# Patient Record
Sex: Female | Born: 1965 | Race: White | Hispanic: No | Marital: Married | Smoking: Current every day smoker
Health system: Southern US, Community
[De-identification: ages and names within clinical notes are randomized; demographics above are authoritative.]

## PROBLEM LIST (undated history)

## (undated) DIAGNOSIS — E785 Hyperlipidemia, unspecified: Secondary | ICD-10-CM

## (undated) HISTORY — DX: Hyperlipidemia, unspecified: E78.5

---

## 2001-12-06 HISTORY — PX: ABDOMINAL HYSTERECTOMY: SHX81

## 2002-02-01 ENCOUNTER — Observation Stay (HOSPITAL_COMMUNITY): Admission: RE | Admit: 2002-02-01 | Discharge: 2002-02-02 | Payer: Self-pay | Admitting: Obstetrics and Gynecology

## 2003-10-29 ENCOUNTER — Other Ambulatory Visit: Admission: RE | Admit: 2003-10-29 | Discharge: 2003-10-29 | Payer: Self-pay | Admitting: Obstetrics and Gynecology

## 2005-01-20 ENCOUNTER — Other Ambulatory Visit: Admission: RE | Admit: 2005-01-20 | Discharge: 2005-01-20 | Payer: Self-pay | Admitting: Obstetrics and Gynecology

## 2007-03-27 ENCOUNTER — Ambulatory Visit (HOSPITAL_COMMUNITY): Admission: RE | Admit: 2007-03-27 | Discharge: 2007-03-27 | Payer: Self-pay | Admitting: Obstetrics and Gynecology

## 2015-02-18 ENCOUNTER — Other Ambulatory Visit: Payer: Self-pay | Admitting: Obstetrics and Gynecology

## 2015-02-18 DIAGNOSIS — E041 Nontoxic single thyroid nodule: Secondary | ICD-10-CM

## 2015-02-21 ENCOUNTER — Ambulatory Visit (HOSPITAL_COMMUNITY)
Admission: RE | Admit: 2015-02-21 | Discharge: 2015-02-21 | Disposition: A | Discharge status: Home / Self Care | Payer: Managed Care, Other (non HMO) | Source: Ambulatory Visit | Attending: Obstetrics and Gynecology | Admitting: Obstetrics and Gynecology

## 2015-02-21 DIAGNOSIS — E041 Nontoxic single thyroid nodule: Secondary | ICD-10-CM | POA: Insufficient documentation

## 2015-02-26 ENCOUNTER — Other Ambulatory Visit: Payer: Self-pay | Admitting: Obstetrics and Gynecology

## 2015-02-26 DIAGNOSIS — E041 Nontoxic single thyroid nodule: Secondary | ICD-10-CM

## 2015-03-12 ENCOUNTER — Other Ambulatory Visit: Payer: Self-pay | Admitting: Obstetrics and Gynecology

## 2015-03-12 ENCOUNTER — Ambulatory Visit
Admission: RE | Admit: 2015-03-12 | Discharge: 2015-03-12 | Disposition: A | Discharge status: Home / Self Care | Payer: Managed Care, Other (non HMO) | Source: Ambulatory Visit | Attending: Obstetrics and Gynecology | Admitting: Obstetrics and Gynecology

## 2015-03-12 DIAGNOSIS — E041 Nontoxic single thyroid nodule: Secondary | ICD-10-CM

## 2015-04-04 ENCOUNTER — Telehealth: Payer: Self-pay | Admitting: *Deleted

## 2015-04-04 NOTE — Telephone Encounter (Signed)
Unable to reach patient at time of Pre-Visit Call.  Left message for patient to return call when available.    

## 2015-04-07 ENCOUNTER — Encounter: Payer: Self-pay | Admitting: Family

## 2015-04-07 ENCOUNTER — Ambulatory Visit (INDEPENDENT_AMBULATORY_CARE_PROVIDER_SITE_OTHER): Payer: Managed Care, Other (non HMO) | Admitting: Family

## 2015-04-07 VITALS — BP 120/80 | HR 73 | Temp 97.9°F | Resp 16 | Ht 63.5 in | Wt 180.4 lb

## 2015-04-07 DIAGNOSIS — Z23 Encounter for immunization: Secondary | ICD-10-CM

## 2015-04-07 DIAGNOSIS — E119 Type 2 diabetes mellitus without complications: Secondary | ICD-10-CM | POA: Insufficient documentation

## 2015-04-07 DIAGNOSIS — L309 Dermatitis, unspecified: Secondary | ICD-10-CM | POA: Diagnosis not present

## 2015-04-07 DIAGNOSIS — E785 Hyperlipidemia, unspecified: Secondary | ICD-10-CM | POA: Insufficient documentation

## 2015-04-07 DIAGNOSIS — Z720 Tobacco use: Secondary | ICD-10-CM

## 2015-04-07 LAB — BASIC METABOLIC PANEL
BUN: 9 mg/dL (ref 6–23)
CHLORIDE: 105 meq/L (ref 96–112)
CO2: 31 meq/L (ref 19–32)
Calcium: 9.9 mg/dL (ref 8.4–10.5)
Creatinine, Ser: 0.74 mg/dL (ref 0.40–1.20)
GFR: 88.82 mL/min (ref >60.00)
GLUCOSE: 86 mg/dL (ref 70–99)
Potassium: 3.9 mEq/L (ref 3.5–5.1)
SODIUM: 142 meq/L (ref 135–145)

## 2015-04-07 LAB — LIPID PANEL
CHOLESTEROL: 249 mg/dL — AB (ref 0–200)
HDL: 29.4 mg/dL — AB (ref >39.00)
LDL CALC: 191 mg/dL — AB (ref 0–99)
NONHDL: 219.6
Total CHOL/HDL Ratio: 8
Triglycerides: 142 mg/dL (ref 0.0–149.0)
VLDL: 28.4 mg/dL (ref 0.0–40.0)

## 2015-04-07 LAB — HEPATIC FUNCTION PANEL
ALBUMIN: 4 g/dL (ref 3.5–5.2)
ALK PHOS: 81 U/L (ref 39–117)
ALT: 42 U/L — AB (ref 0–35)
AST: 26 U/L (ref 0–37)
BILIRUBIN DIRECT: 0.1 mg/dL (ref 0.0–0.3)
TOTAL PROTEIN: 7.8 g/dL (ref 6.0–8.3)
Total Bilirubin: 0.5 mg/dL (ref 0.2–1.2)

## 2015-04-07 LAB — HEMOGLOBIN A1C: HEMOGLOBIN A1C: 6.2 % (ref 4.6–6.5)

## 2015-04-07 LAB — MICROALBUMIN / CREATININE URINE RATIO
Creatinine,U: 41.5 mg/dL
MICROALB/CREAT RATIO: 1.7 mg/g (ref 0.0–30.0)

## 2015-04-07 MED ORDER — ASPIRIN EC 81 MG PO TBEC: 81.0000 mg | DELAYED_RELEASE_TABLET | Freq: Every day | ORAL | Status: AC

## 2015-04-07 MED ORDER — BETAMETHASONE DIPROPIONATE 0.05 % EX CREA: TOPICAL_CREAM | Freq: Every day | CUTANEOUS | Status: AC

## 2015-04-07 NOTE — Progress Notes (Signed)
Subjective:    Patient ID: Mckenzie Morgan, female    DOB: 06/16/1966, 49 y.o.   MRN: [id number]  HPI  Mckenzie Morgan is a 49 yr old female who presents today to establish care. Pmhx is significant for the following:  1) DM2-  Currently diet controlled. Reports that her sugars run 90-110 fasting.  Reports fair compliance with diet.  Last eye exam was 2 years ago.  Sees Dr. Daphine DeutscherMartin in [place].  Reports maternal GM and Paternal GF, and dad have diabetes.    2) Hyperlipidemia-  Not currently on medication.    3) Tobacco abuse- smokes 1/2 PPD.  She quit during pregnancies.  Not motivated to quit.    Review of Systems  Constitutional: Negative for unexpected weight change.  HENT: Negative for hearing loss and rhinorrhea.   Eyes: Negative for visual disturbance.  Respiratory: Negative for cough.   Cardiovascular: Negative for leg swelling.  Gastrointestinal: Negative for nausea, diarrhea and constipation.  Genitourinary: Negative for dysuria and frequency.  Musculoskeletal: Negative for myalgias and arthralgias.  Skin:       Reports eczema.   Neurological: Negative for headaches.  Hematological: Negative for adenopathy.  Psychiatric/Behavioral: Negative for dysphoric mood and agitation.   Past Medical History  Diagnosis Date  . Hyperlipidemia     History   Social History  . Marital Status: Married    Spouse Name: N/A  . Number of Children: N/A  . Years of Education: N/A   Occupational History  . Not on file.   Social History Main Topics  . Smoking status: Current Every Day Smoker -- 0.50 packs/day for 25 years    Types: Cigarettes  . Smokeless tobacco: Not on file  . Alcohol Use: No  . Drug Use: No  . Sexual Activity: Not on file   Other Topics Concern  . Not on file   Social History Narrative   3 grown children (one grandchild)   Son has moved back home- dropped out of college   Married for 30 years   Works as a Theatre stage managerfood manager for [employer]   Enjoys reading,  spending time with grandchild, watches him during her days off during the week.   No pets    Past Surgical History  Procedure Laterality Date  . Abdominal hysterectomy  2003  . Cesarean section      x 2    Family History  Problem Relation Age of Onset  . Hyperlipidemia Mother   . Heart disease Father     heart attack  . Hypertension Father   . Diabetes Maternal Grandmother   . Diabetes Paternal Grandfather     No Known Allergies  No current outpatient prescriptions on file prior to visit.   No current facility-administered medications on file prior to visit.    BP 120/80 mmHg  Pulse 73  Temp(Src) 97.9 F (36.6 C) (Oral)  Resp 16  Ht 5' 3.5" (1.613 m)  Wt 180 lb 6.4 oz (81.829 kg)  BMI 31.45 kg/m2  SpO2 98%  LMP 12/06/2001       Objective:   Physical Exam  Constitutional: She is oriented to person, place, and time. She appears well-developed and well-nourished.  HENT:  Head: Normocephalic and atraumatic.  Cardiovascular: Normal rate, regular rhythm and normal heart sounds.   No murmur heard. Pulmonary/Chest: Effort normal and breath sounds normal. No respiratory distress. She has no wheezes.  Musculoskeletal: She exhibits no edema.  Neurological: She is alert and oriented to  person, place, and time.  Psychiatric: She has a normal mood and affect. Her behavior is normal. Judgment and thought content normal.  skin: see DM foot exam        Assessment & Plan:

## 2015-04-07 NOTE — Assessment & Plan Note (Signed)
Pt is unmotivated to quit. We did discuss various cessation options (patch, gum, chantix, zyban). She will consider.  5 minutes spent on tobacco cessation counseling today.

## 2015-04-07 NOTE — Progress Notes (Signed)
Pre visit review using our clinic review tool, if applicable. No additional management support is needed unless otherwise documented below in the visit note. 

## 2015-04-07 NOTE — Assessment & Plan Note (Signed)
Obtain lipid panel

## 2015-04-07 NOTE — Addendum Note (Signed)
Addended by: Mervin KungFERGERSON, Denay Pleitez A on: 04/07/2015 07:19 PM   Modules accepted: Orders

## 2015-04-07 NOTE — Assessment & Plan Note (Signed)
Pneumovax today, pt to schedule routine eye exam. Obtain a1c, urine microalbumin, bmet.  Continue diabetic diet.  Pneumovax today.

## 2015-04-07 NOTE — Patient Instructions (Signed)
Please complete lab work prior to leaving. Work on quitting smoking- let me know if you decide you would like to try chantix. You may apply betamethasone cream daily to affected areas on your feet. Please schedule a routine eye exam for diabetic eye check. Add aspirin 81mg  once daily for heart protection. Schedule complete physical at the front desk. Welcome to [facility]!

## 2015-04-07 NOTE — Assessment & Plan Note (Signed)
Trial of betamethasone cream.

## 2015-04-10 ENCOUNTER — Other Ambulatory Visit: Payer: Self-pay | Admitting: Family

## 2015-04-10 DIAGNOSIS — R748 Abnormal levels of other serum enzymes: Secondary | ICD-10-CM

## 2015-04-10 DIAGNOSIS — E785 Hyperlipidemia, unspecified: Secondary | ICD-10-CM

## 2015-04-10 NOTE — Telephone Encounter (Signed)
Sugar remains in borderline diabetes range.  Cholesterol is extremely high.  I would recommend that she start a statin in addition to low fat/low cholesterol diet.  Repeat flp/lft in 6 weeks.  Also, one of her liver tests is mildly elevated.  Most likely cause is fatty liver. We will keep an eye on this.  Working on diet, exercise and weight loss is best treatment for fatty liver.

## 2015-04-11 MED ORDER — ATORVASTATIN CALCIUM 40 MG PO TABS: 40.0000 mg | ORAL_TABLET | Freq: Every day | ORAL | Status: AC

## 2015-04-11 NOTE — Telephone Encounter (Signed)
Notified pt and she voices understanding. Lab appt scheduled for 07/04/15 at 8:15am. Future orders entered.

## 2015-04-25 ENCOUNTER — Telehealth: Payer: Self-pay | Admitting: *Deleted

## 2015-04-25 ENCOUNTER — Encounter: Payer: Self-pay | Admitting: *Deleted

## 2015-04-25 NOTE — Telephone Encounter (Signed)
Pre-Visit Call completed with patient and chart updated.   Pre-Visit Info documented in Specialty Comments under [employer].    

## 2015-04-28 ENCOUNTER — Encounter: Payer: Managed Care, Other (non HMO) | Admitting: Family

## 2015-05-07 ENCOUNTER — Telehealth: Payer: Self-pay | Admitting: Family

## 2015-05-07 NOTE — Telephone Encounter (Signed)
Pre Visit letter sent  °

## 2015-05-07 NOTE — Telephone Encounter (Signed)
error 

## 2015-05-13 ENCOUNTER — Encounter: Payer: Managed Care, Other (non HMO) | Admitting: Family

## 2015-05-22 ENCOUNTER — Telehealth: Payer: Self-pay | Admitting: *Deleted

## 2015-05-22 NOTE — Telephone Encounter (Signed)
Received records from [facility] and forwarded to PCP for review.

## 2015-06-12 ENCOUNTER — Encounter: Payer: Self-pay | Admitting: Family

## 2015-06-12 ENCOUNTER — Telehealth: Payer: Self-pay | Admitting: Family

## 2015-06-12 DIAGNOSIS — E559 Vitamin D deficiency, unspecified: Secondary | ICD-10-CM

## 2015-06-15 NOTE — Telephone Encounter (Signed)
Opened in error

## 2015-07-07 ENCOUNTER — Other Ambulatory Visit: Payer: Managed Care, Other (non HMO)

## 2015-08-26 ENCOUNTER — Telehealth: Payer: Self-pay | Admitting: *Deleted

## 2015-08-26 NOTE — Telephone Encounter (Signed)
Received fax from [facility] in [facility] for HCTZ  once a day. No record of medication in system. Left detailed message on voicemail to call and let us know when she was started on medication and who prescribed it.

## 2015-08-29 NOTE — Telephone Encounter (Signed)
Left message for pt to call and verify below request.

## 2016-08-09 IMAGING — US US SOFT TISSUE HEAD/NECK
2 series · 14 of 25 positions shown · non-contrast
Comparison: 03/27/2007

CLINICAL DATA: Thyroid nodules

EXAM:
THYROID ULTRASOUND
TECHNIQUE: Ultrasound examination of the thyroid gland and adjacent soft
tissues was performed.

[Series 1: us soft tissue head/neck · 0.06mm/px · 5 of 33 slices shown (1 of 2)]
[im 1/33]
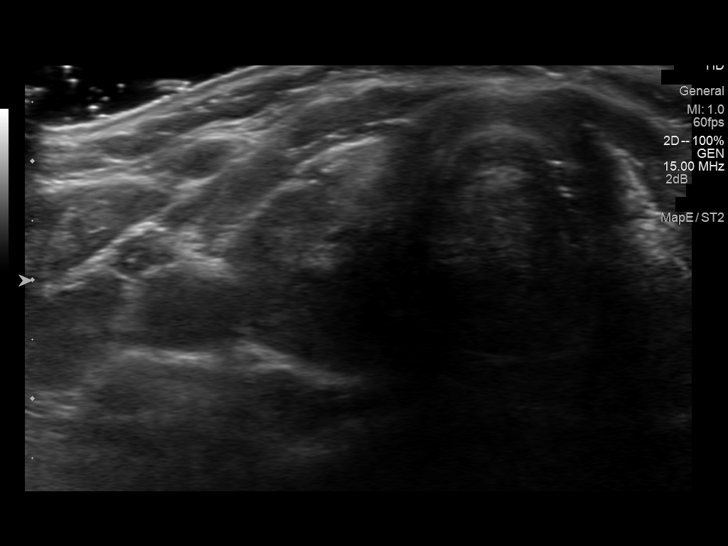
[im 9/33]
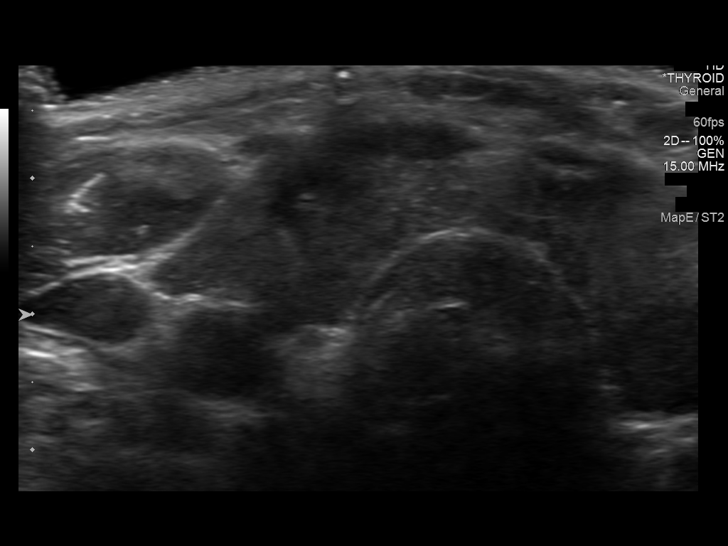
[im 17/33]
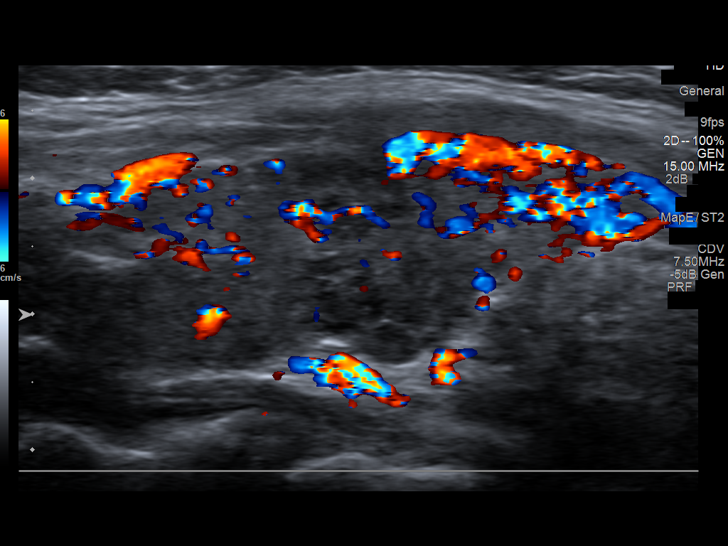
[im 25/33]
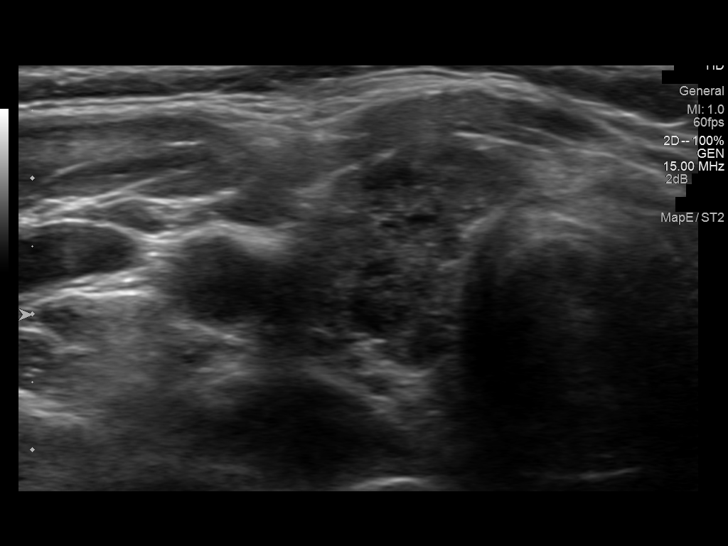
[im 33/33]
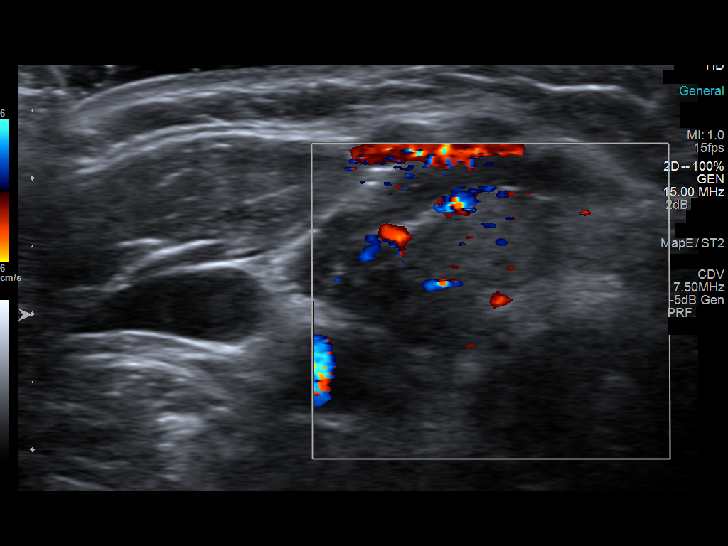

[Series 2: us soft tissue head/neck · 0.05mm/px · 57 acquisitions, 9 frames shown (2 of 2)]
[im 1/57]
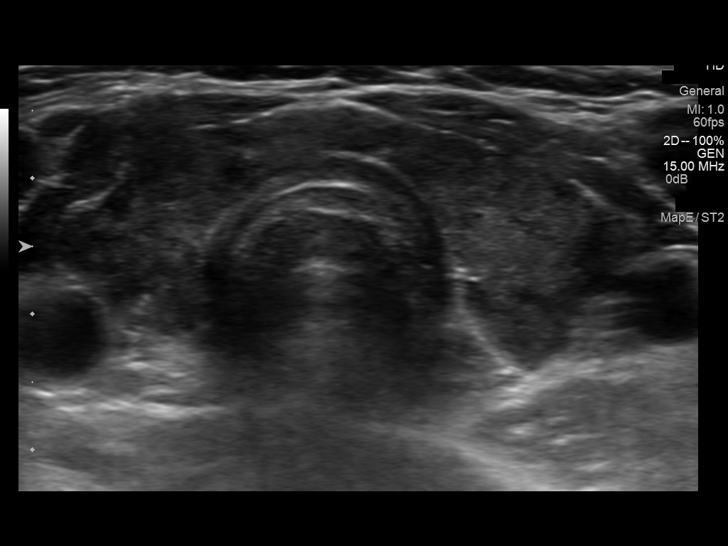
[im 8/57]
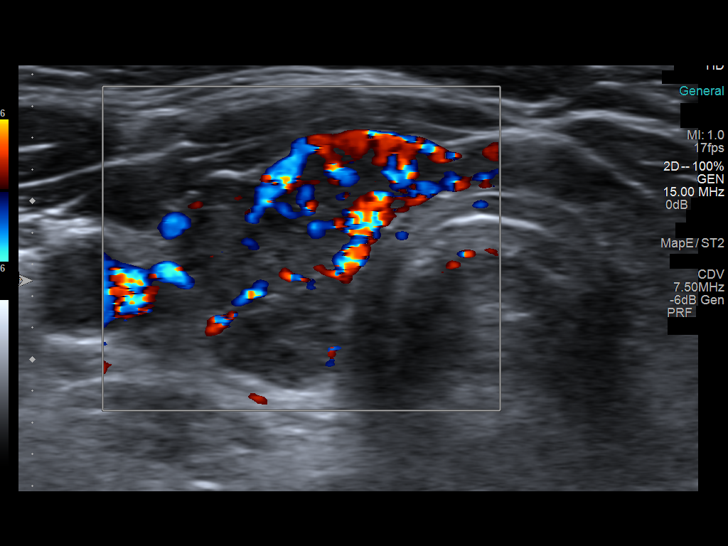
[im 15/57]
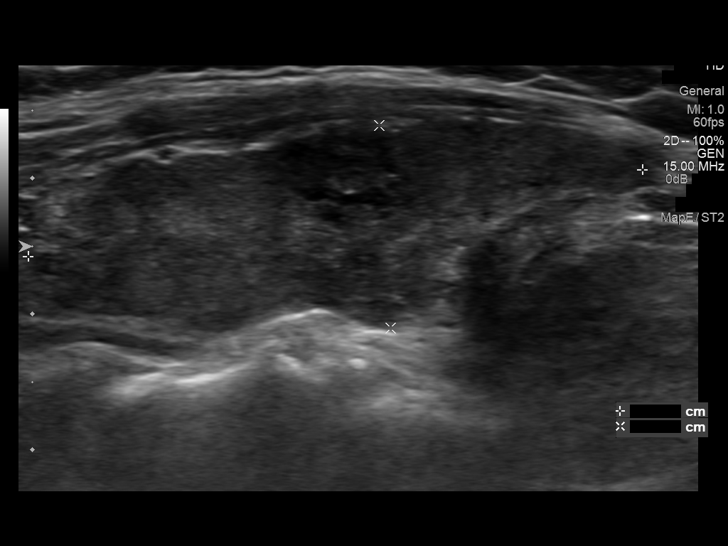
[im 23/57]
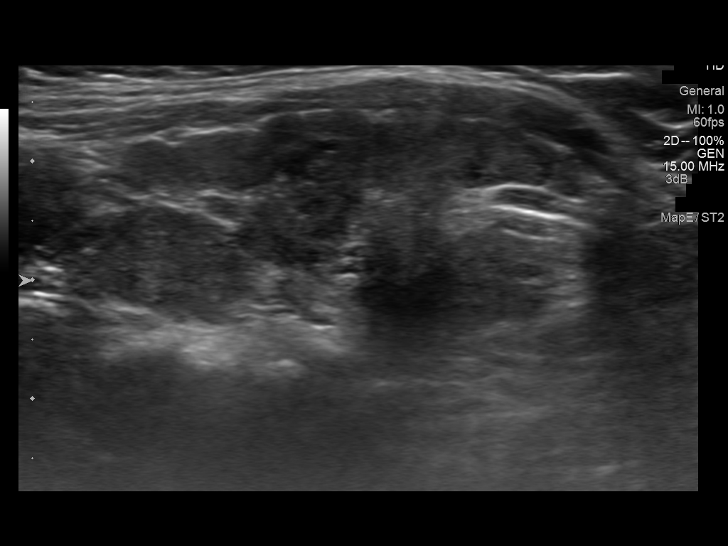
[im 27/57]
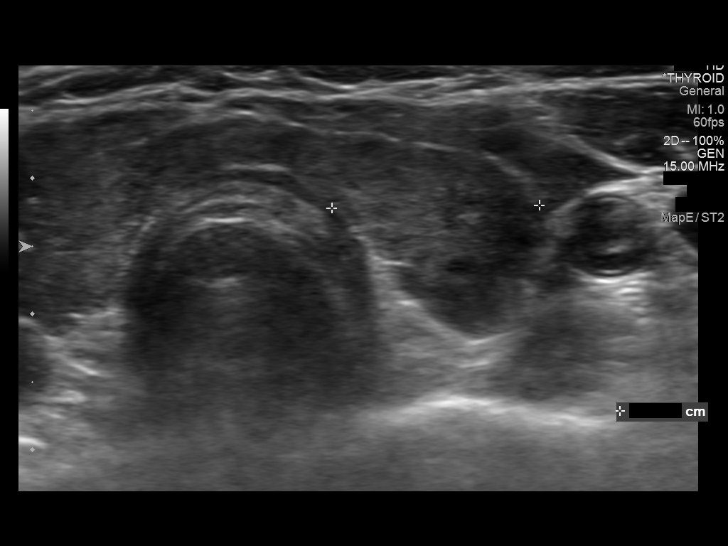
[im 34/57]
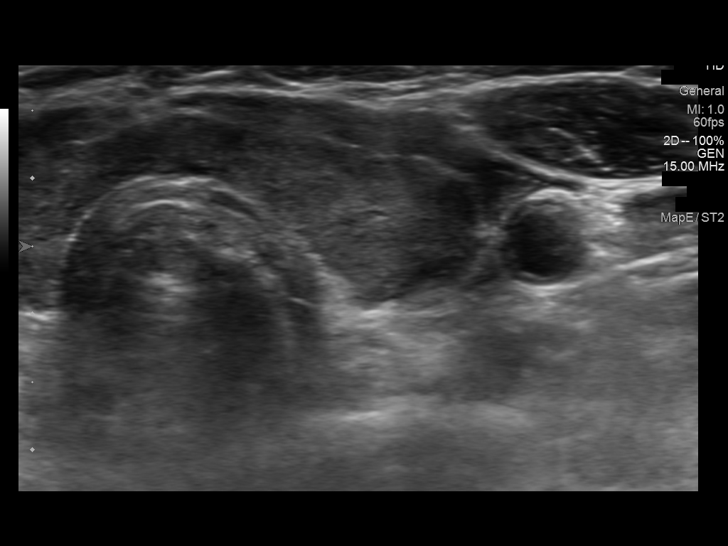
[im 42/57]
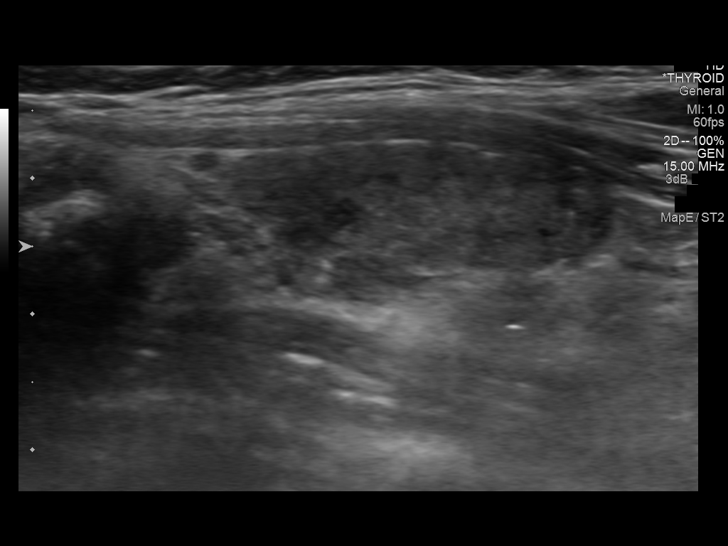
[im 49/57]
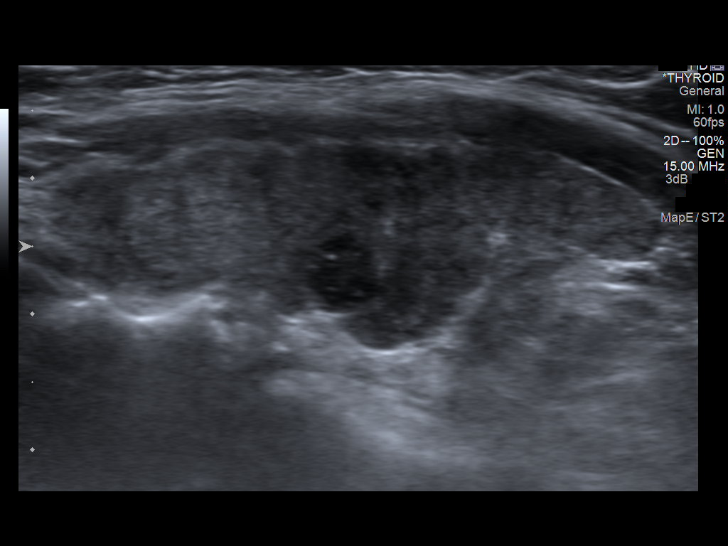
[im 57/57]
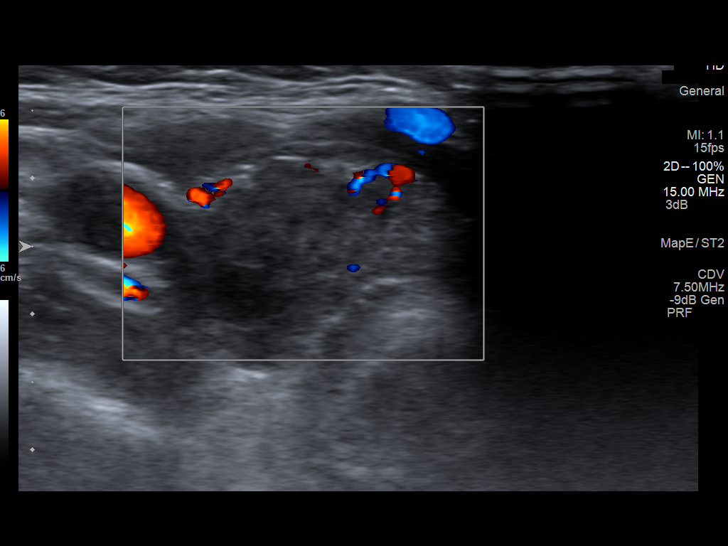

[14 of 25 positions shown; findings below may reference images not displayed]

FINDINGS: Right thyroid lobe

Measurements: 4.6 x 1.5 x 1.3 cm. Heterogeneous background
echotexture. Ill-defined right midpole solid hypoechoic thyroid
nodule measures 14 x 11 x 13 mm. This appears more pronounced than
the prior study. Because of this change in appearance, recommend FNA
biopsy.

Left thyroid lobe

Measurements: 3.6 x 1.5 x 1.4 cm. Similar mild heterogeneous macro
lobular appearance. No discrete nodule or interval change.

Isthmus

Thickness: 3.5 mm.  No nodule visualized

Lymphadenopathy

None visualized.
IMPRESSION: 14 mm right midpole ill-defined hypoechoic solid nodule, more
pronounced since prior exam. Recommend FNA biopsy.

## 2016-08-28 IMAGING — US US SOFT TISSUE HEAD/NECK
1 series · 12 of 12 positions shown · non-contrast
Comparison: 02/21/2015

CLINICAL DATA: Right thyroid nodule, evaluate for biopsy

EXAM:
ULTRASOUND OF HEAD/NECK SOFT TISSUES
TECHNIQUE: Ultrasound examination of the head and neck soft tissues was
performed in the area of clinical concern.

[Series 1: us soft tissue head/neck · 0.08mm/px · 12 of 12 slices shown]
[im 1/12]
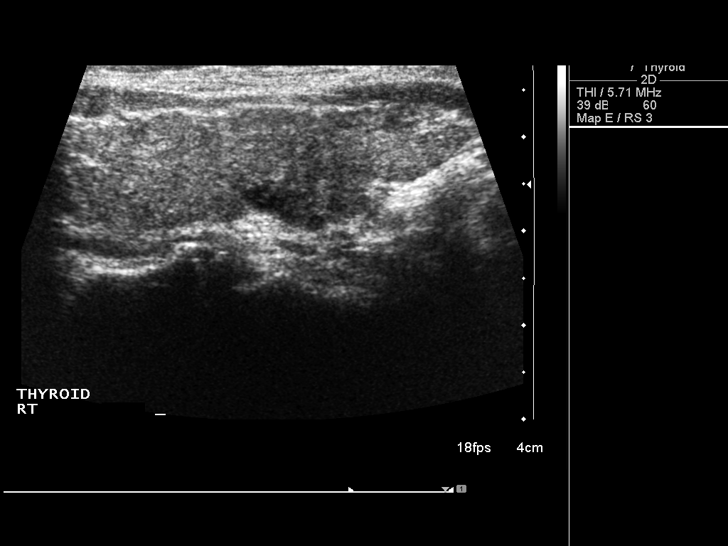
[im 2/12]
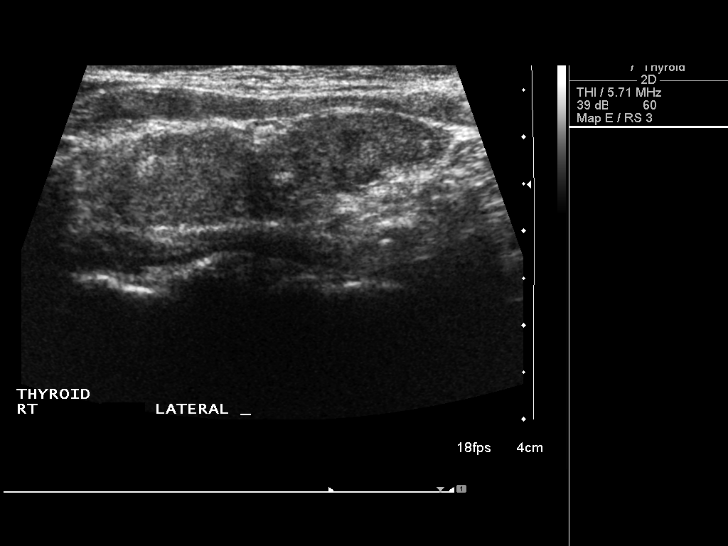
[im 3/12]
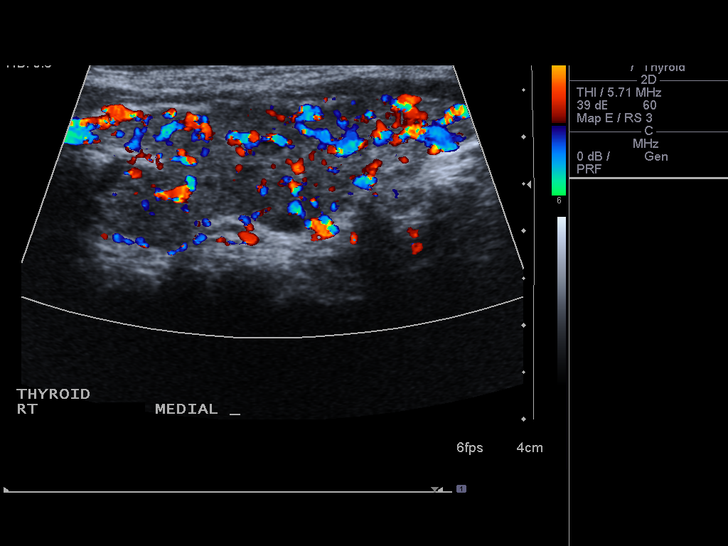
[im 4/12]
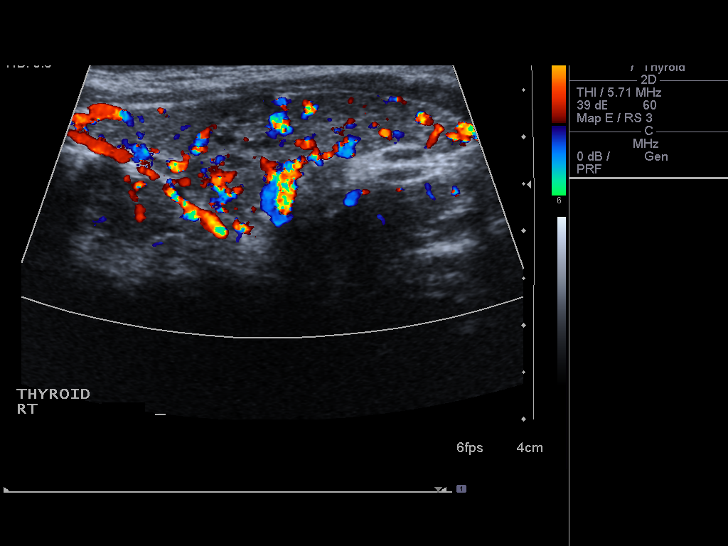
[im 5/12]
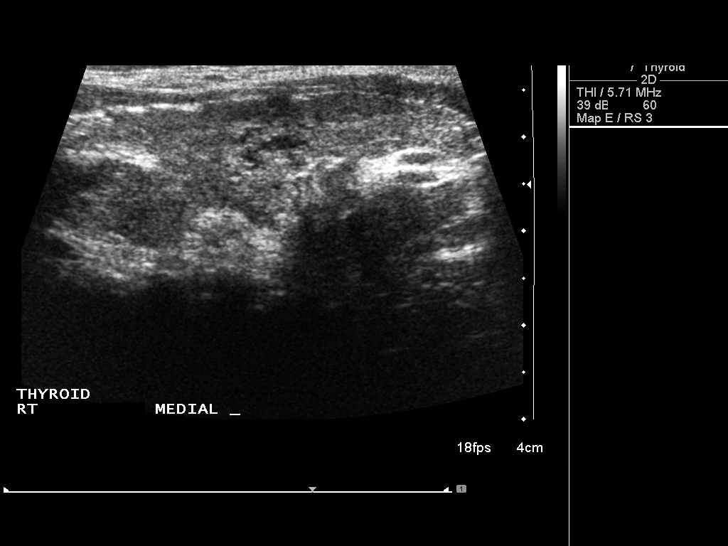
[im 6/12]
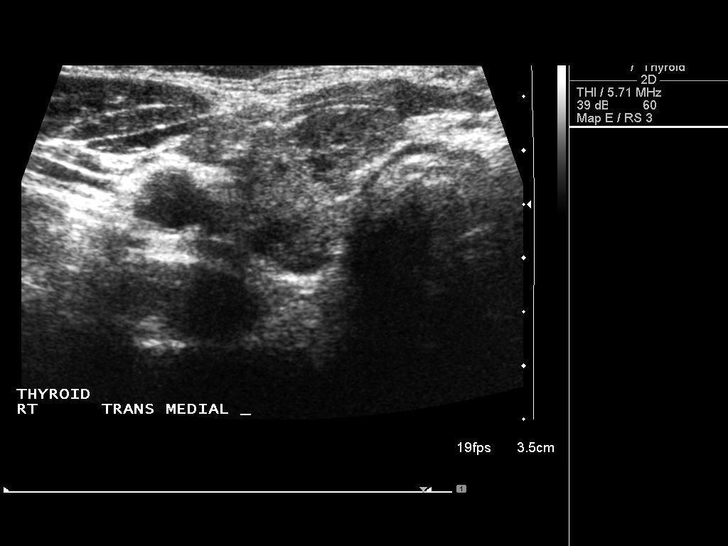
[im 7/12]
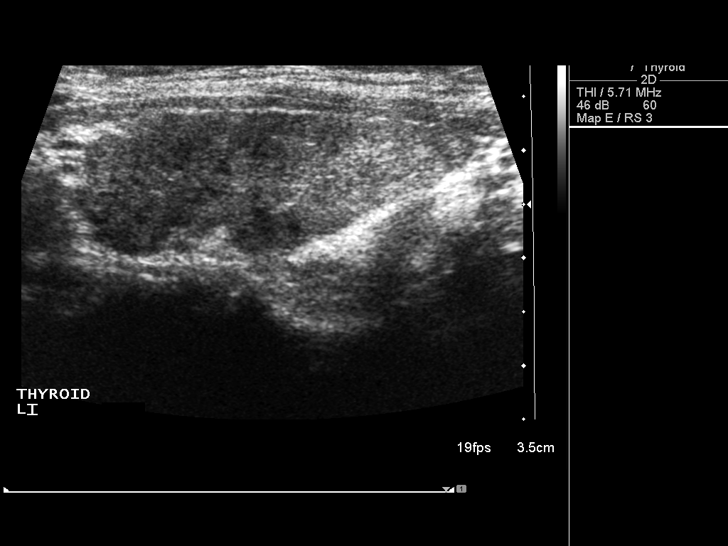
[im 8/12]
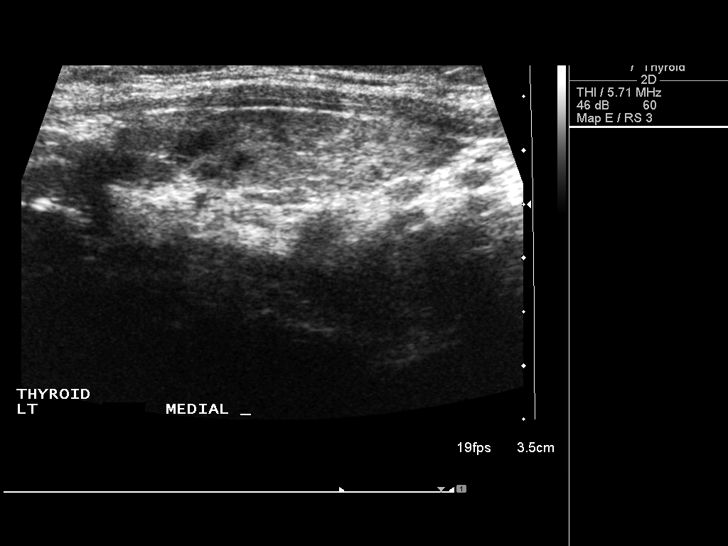
[im 9/12]
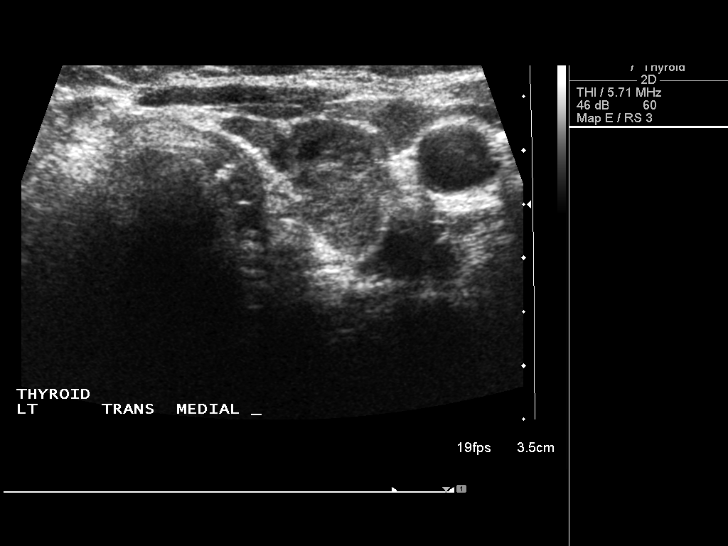
[im 10/12]
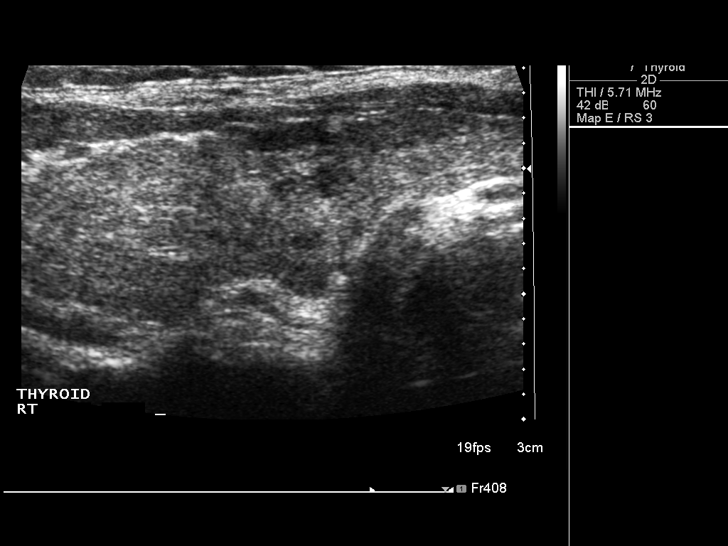
[im 11/12]
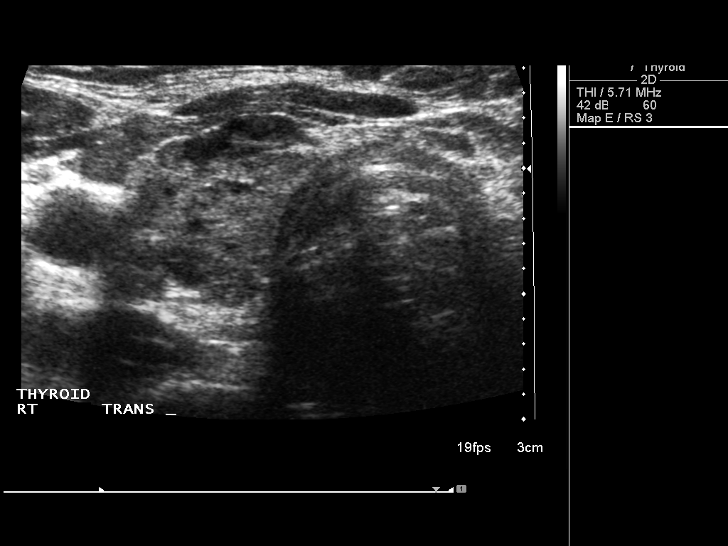
[im 12/12]
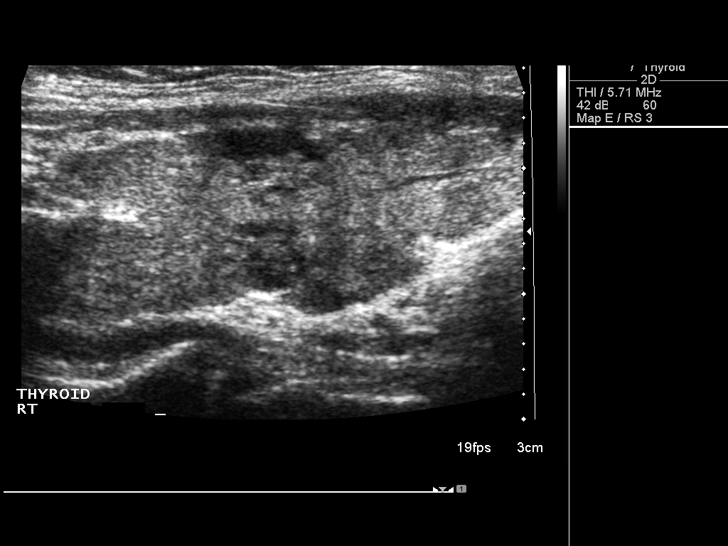

[12 of 12 positions shown; findings below may reference images not displayed]

FINDINGS: Ultrasound was performed in preparation for possible right midpole
nodule biopsy. Repeat ultrasound demonstrates diffuse nodular gland
heterogeneity. No definite discrete nodule. Previous nodule appears
to represent a pseudo nodule related to the heterogeneous gland and
background thyroid goiter. Therefore biopsy not performed.
IMPRESSION: Heterogeneous nodular gland related to multi nodular goiter.
Therefore, the right midpole abnormality previously described is
compatible with a pseudo nodule.

Recommend repeat thyroid ultrasound in 1 year.

## 2017-04-19 ENCOUNTER — Other Ambulatory Visit: Payer: Self-pay | Admitting: Obstetrics and Gynecology

## 2017-04-19 DIAGNOSIS — E049 Nontoxic goiter, unspecified: Secondary | ICD-10-CM

## 2017-04-27 ENCOUNTER — Other Ambulatory Visit: Payer: Managed Care, Other (non HMO)

## 2017-05-05 ENCOUNTER — Ambulatory Visit
Admission: RE | Admit: 2017-05-05 | Discharge: 2017-05-05 | Disposition: A | Discharge status: Home / Self Care | Payer: 59 | Source: Ambulatory Visit | Attending: Obstetrics and Gynecology | Admitting: Obstetrics and Gynecology

## 2017-05-05 DIAGNOSIS — E049 Nontoxic goiter, unspecified: Secondary | ICD-10-CM

## 2018-12-18 IMAGING — US US THYROID
1 series · 13 of 25 positions shown · non-contrast
Comparison: 03/12/2015, 02/21/2015 and 03/27/2007

CLINICAL DATA: Prior ultrasound follow-up. Thyroid goiter with
prior demonstration of ill-defined nodularity in the right lobe
determined to be a probable pseudo nodule in follow-up.

EXAM:
THYROID ULTRASOUND
TECHNIQUE: Ultrasound examination of the thyroid gland and adjacent soft
tissues was performed.

[Series 1: us thyroid · 0.04mm/px · 13 of 42 slices shown]
[im 1/42]
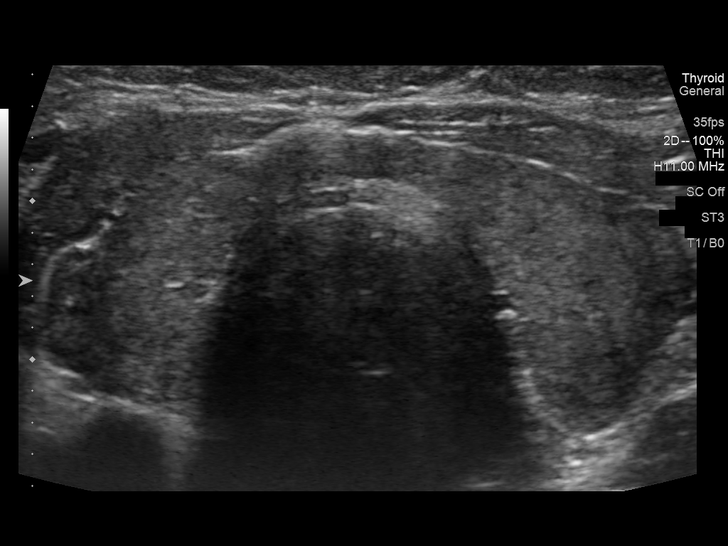
[im 4/42]
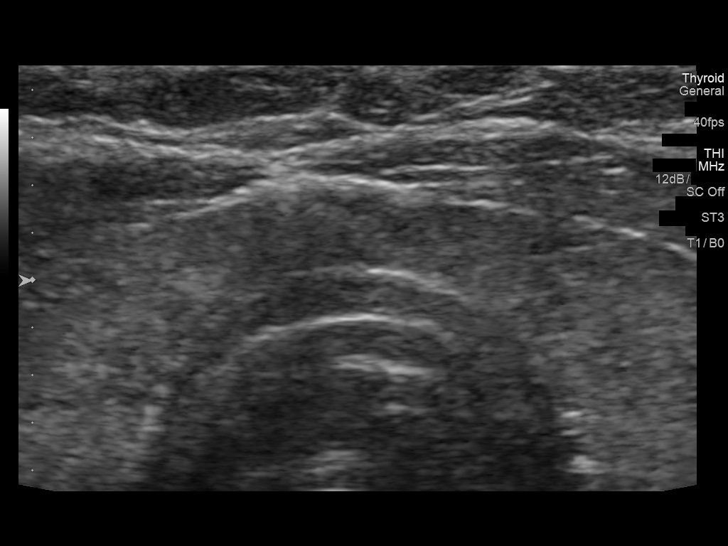
[im 7/42]
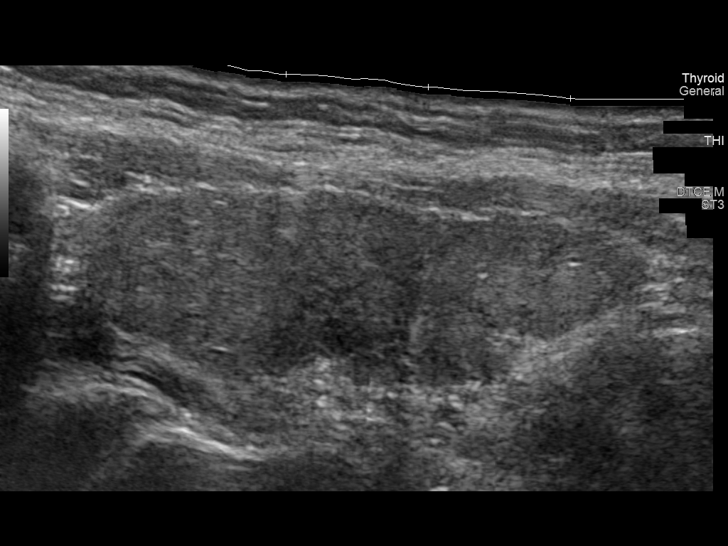
[im 11/42]
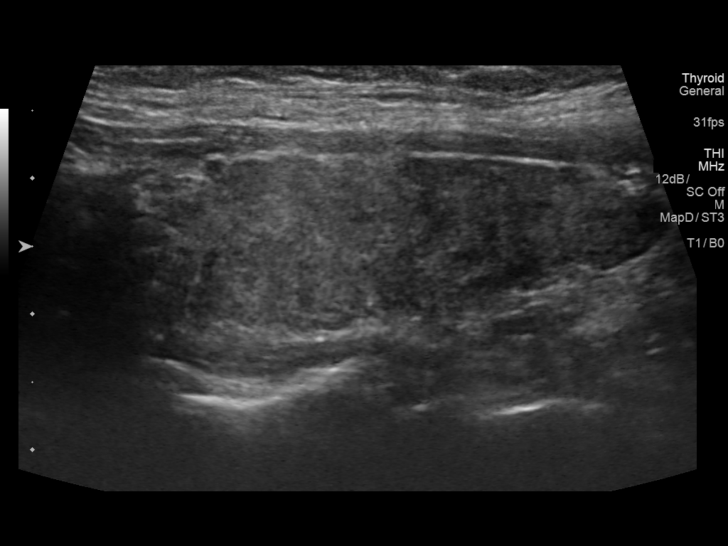
[im 14/42]
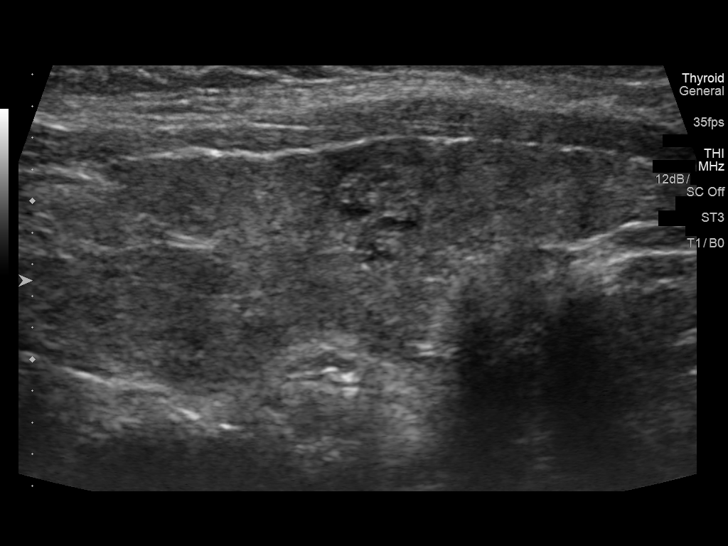
[im 18/42]
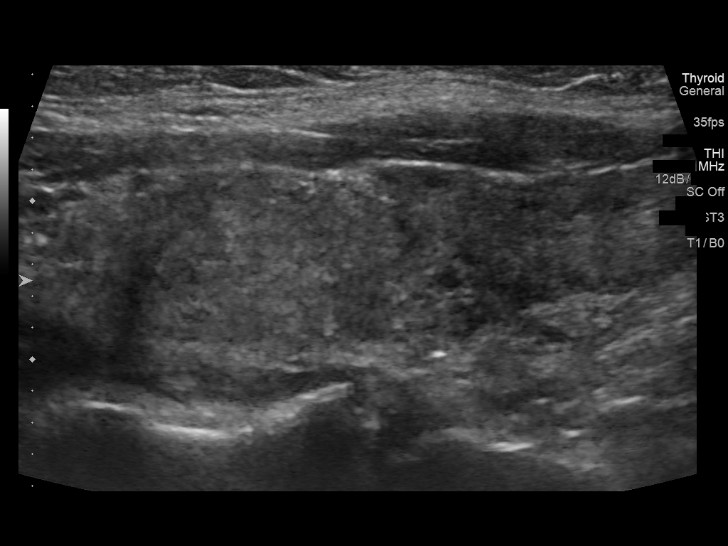
[im 21/42]
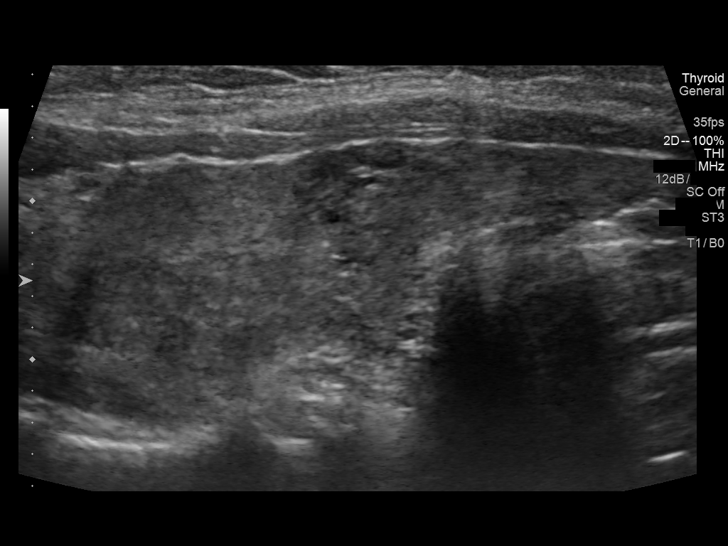
[im 24/42]
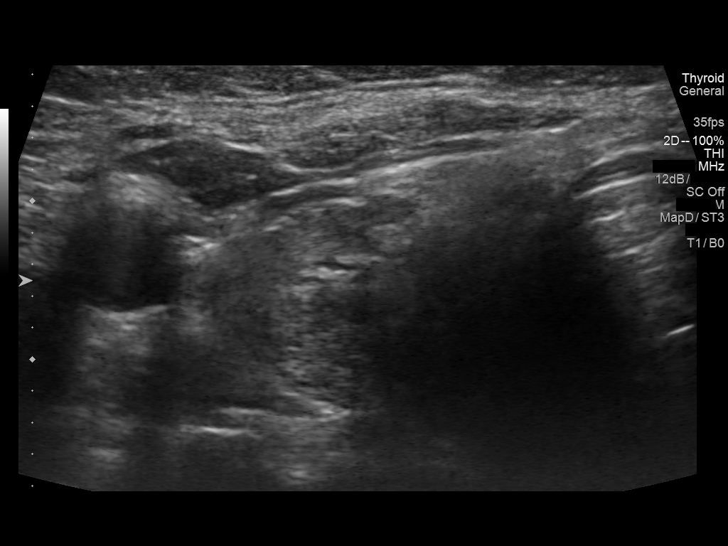
[im 28/42]
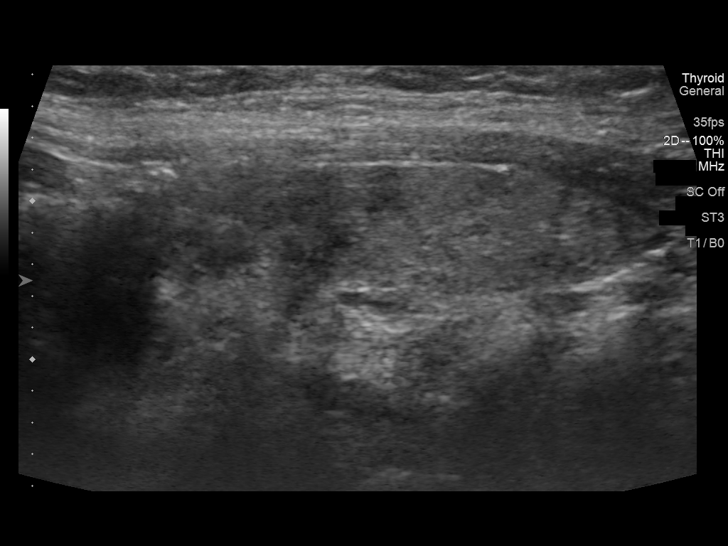
[im 31/42]
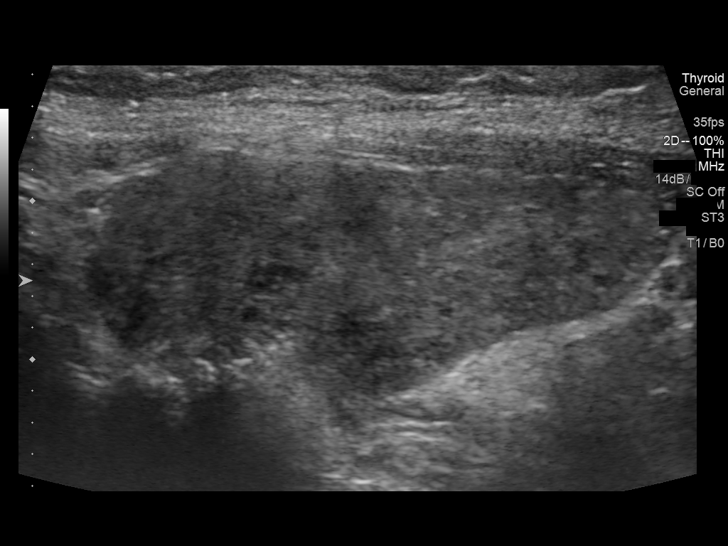
[im 35/42]
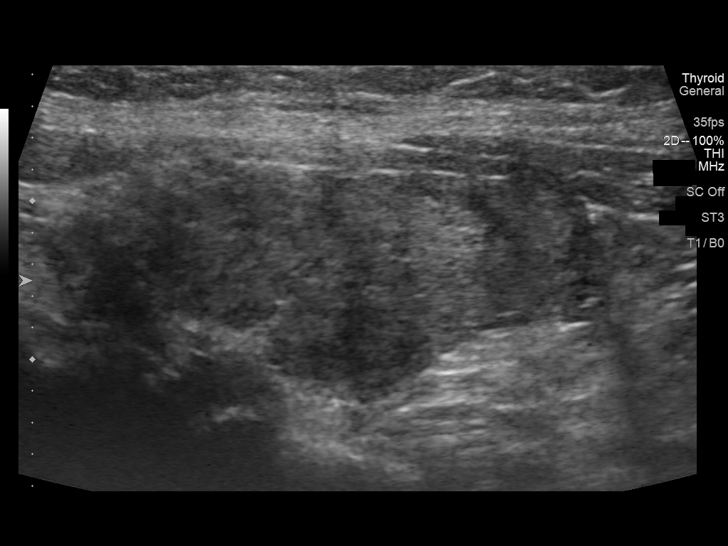
[im 38/42]
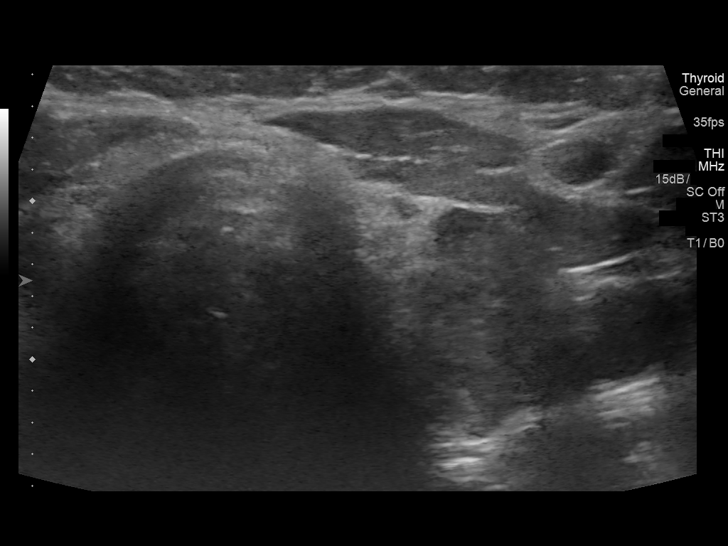
[im 42/42]
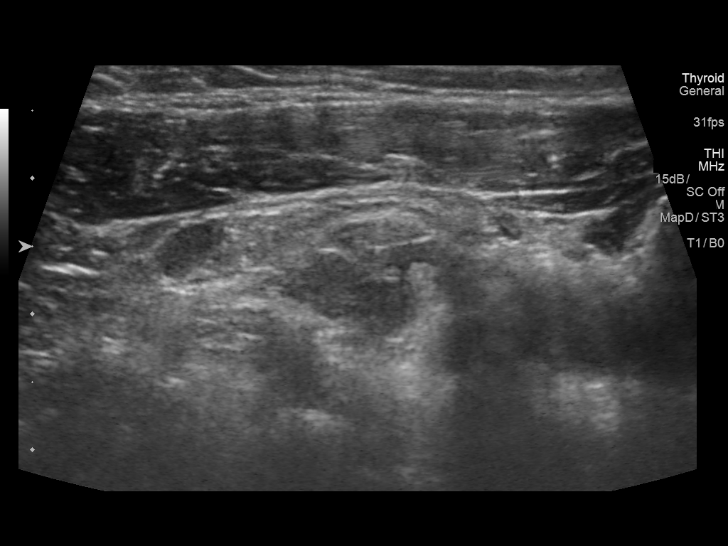

[13 of 25 positions shown; findings below may reference images not displayed]

FINDINGS: Parenchymal Echotexture: Moderately heterogenous

Isthmus: 0.3 cm

Right lobe: 4.0 x 1.4 x 1.6 cm

Left lobe: 3.8 x 1.5 x 1.3 cm

_________________________________________________________

Estimated total number of nodules >/= 1 cm: 0

Number of spongiform nodules >/=  2 cm not described below (TR1): 0

Number of mixed cystic and solid nodules >/= 1.5 cm not described
below (TR2): 0

_________________________________________________________

Stable heterogeneous appearance of the thyroid gland without focal
discrete nodule. No enlarged lymph nodes identified.
IMPRESSION: Stable heterogeneous thyroid gland without focal nodule. No further
need for sonographic follow-up unless there is a new clinical
indication to image the thyroid gland.

The above is in keeping with the ACR TI-RADS recommendations - [HOSPITAL] 0952;[DATE].
